# Patient Record
Sex: Male | Born: 1985 | Race: Black or African American | Hispanic: No | Marital: Single | Smoking: Current every day smoker
Health system: Southern US, Community
[De-identification: ages and names within clinical notes are randomized; demographics above are authoritative.]

## PROBLEM LIST (undated history)

## (undated) DIAGNOSIS — Z789 Other specified health status: Secondary | ICD-10-CM

## (undated) DIAGNOSIS — K219 Gastro-esophageal reflux disease without esophagitis: Secondary | ICD-10-CM

## (undated) HISTORY — PX: WRIST SURGERY: SHX841

---

## 2004-12-14 ENCOUNTER — Emergency Department: Payer: Self-pay | Admitting: Emergency Medicine

## 2005-06-13 ENCOUNTER — Emergency Department: Payer: Self-pay | Admitting: Emergency Medicine

## 2006-05-14 ENCOUNTER — Inpatient Hospital Stay: Payer: Self-pay | Admitting: Internal Medicine

## 2009-12-18 ENCOUNTER — Emergency Department: Payer: Self-pay | Admitting: Internal Medicine

## 2014-07-27 ENCOUNTER — Emergency Department: Payer: Self-pay | Admitting: Emergency Medicine

## 2014-07-27 LAB — COMPREHENSIVE METABOLIC PANEL
ALK PHOS: 74 U/L
ALT: 46 U/L
Albumin: 4 g/dL (ref 3.4–5.0)
Anion Gap: 8 (ref 7–16)
BILIRUBIN TOTAL: 0.7 mg/dL (ref 0.2–1.0)
BUN: 14 mg/dL (ref 7–18)
CHLORIDE: 103 mmol/L (ref 98–107)
CO2: 24 mmol/L (ref 21–32)
CREATININE: 1.49 mg/dL — AB (ref 0.60–1.30)
Calcium, Total: 9 mg/dL (ref 8.5–10.1)
EGFR (African American): >60
EGFR (Non-African Amer.): 60 — ABNORMAL LOW
GLUCOSE: 135 mg/dL — AB (ref 65–99)
Osmolality: 273 (ref 275–301)
POTASSIUM: 3.9 mmol/L (ref 3.5–5.1)
SGOT(AST): 36 U/L (ref 15–37)
Sodium: 135 mmol/L — ABNORMAL LOW (ref 136–145)
Total Protein: 8.3 g/dL — ABNORMAL HIGH (ref 6.4–8.2)

## 2014-07-27 LAB — CBC WITH DIFFERENTIAL/PLATELET
Basophil #: 0 10*3/uL (ref 0.0–0.1)
Basophil %: 0.5 %
EOS PCT: 0.1 %
Eosinophil #: 0 10*3/uL (ref 0.0–0.7)
HCT: 41.4 % (ref 40.0–52.0)
HGB: 13 g/dL (ref 13.0–18.0)
Lymphocyte #: 0.5 10*3/uL — ABNORMAL LOW (ref 1.0–3.6)
Lymphocyte %: 5.5 %
MCH: 25.6 pg — ABNORMAL LOW (ref 26.0–34.0)
MCHC: 31.4 g/dL — AB (ref 32.0–36.0)
MCV: 82 fL (ref 80–100)
MONO ABS: 0.9 x10 3/mm (ref 0.2–1.0)
MONOS PCT: 9.1 %
Neutrophil #: 8.4 10*3/uL — ABNORMAL HIGH (ref 1.4–6.5)
Neutrophil %: 84.8 %
Platelet: 148 10*3/uL — ABNORMAL LOW (ref 150–440)
RBC: 5.08 10*6/uL (ref 4.40–5.90)
RDW: 13.1 % (ref 11.5–14.5)
WBC: 9.9 10*3/uL (ref 3.8–10.6)

## 2014-07-27 LAB — LIPASE, BLOOD: Lipase: 122 U/L (ref 73–393)

## 2015-05-23 ENCOUNTER — Observation Stay
Admission: EM | Admit: 2015-05-23 | Discharge: 2015-05-24 | Disposition: A | Discharge status: Home / Self Care | Payer: Self-pay | Attending: Internal Medicine | Admitting: Internal Medicine

## 2015-05-23 ENCOUNTER — Emergency Department: Payer: Self-pay

## 2015-05-23 ENCOUNTER — Encounter: Payer: Self-pay | Admitting: Emergency Medicine

## 2015-05-23 DIAGNOSIS — R109 Unspecified abdominal pain: Secondary | ICD-10-CM

## 2015-05-23 DIAGNOSIS — Z23 Encounter for immunization: Secondary | ICD-10-CM | POA: Insufficient documentation

## 2015-05-23 DIAGNOSIS — D72829 Elevated white blood cell count, unspecified: Secondary | ICD-10-CM | POA: Insufficient documentation

## 2015-05-23 DIAGNOSIS — R110 Nausea: Secondary | ICD-10-CM

## 2015-05-23 DIAGNOSIS — Z79899 Other long term (current) drug therapy: Secondary | ICD-10-CM | POA: Insufficient documentation

## 2015-05-23 DIAGNOSIS — F17290 Nicotine dependence, other tobacco product, uncomplicated: Secondary | ICD-10-CM | POA: Insufficient documentation

## 2015-05-23 DIAGNOSIS — K529 Noninfective gastroenteritis and colitis, unspecified: Secondary | ICD-10-CM | POA: Diagnosis present

## 2015-05-23 DIAGNOSIS — R197 Diarrhea, unspecified: Secondary | ICD-10-CM

## 2015-05-23 DIAGNOSIS — A084 Viral intestinal infection, unspecified: Principal | ICD-10-CM | POA: Insufficient documentation

## 2015-05-23 DIAGNOSIS — E860 Dehydration: Secondary | ICD-10-CM | POA: Insufficient documentation

## 2015-05-23 DIAGNOSIS — R112 Nausea with vomiting, unspecified: Secondary | ICD-10-CM | POA: Diagnosis present

## 2015-05-23 HISTORY — DX: Other specified health status: Z78.9

## 2015-05-23 LAB — COMPREHENSIVE METABOLIC PANEL
ALK PHOS: 60 U/L (ref 38–126)
ALT: 25 U/L (ref 17–63)
ANION GAP: 11 (ref 5–15)
AST: 28 U/L (ref 15–41)
Albumin: 4.8 g/dL (ref 3.5–5.0)
BILIRUBIN TOTAL: 0.9 mg/dL (ref 0.3–1.2)
BUN: 19 mg/dL (ref 6–20)
CALCIUM: 9.7 mg/dL (ref 8.9–10.3)
CO2: 23 mmol/L (ref 22–32)
CREATININE: 1.32 mg/dL — AB (ref 0.61–1.24)
Chloride: 109 mmol/L (ref 101–111)
GFR calc non Af Amer: >60 mL/min (ref >60)
Glucose, Bld: 117 mg/dL — ABNORMAL HIGH (ref 65–99)
Potassium: 3.9 mmol/L (ref 3.5–5.1)
Sodium: 143 mmol/L (ref 135–145)
TOTAL PROTEIN: 8.2 g/dL — AB (ref 6.5–8.1)

## 2015-05-23 LAB — CBC
HCT: 45.8 % (ref 40.0–52.0)
Hemoglobin: 14.4 g/dL (ref 13.0–18.0)
MCH: 25.4 pg — ABNORMAL LOW (ref 26.0–34.0)
MCHC: 31.5 g/dL — ABNORMAL LOW (ref 32.0–36.0)
MCV: 80.8 fL (ref 80.0–100.0)
PLATELETS: 166 10*3/uL (ref 150–440)
RBC: 5.67 MIL/uL (ref 4.40–5.90)
RDW: 13.6 % (ref 11.5–14.5)
WBC: 18.7 10*3/uL — AB (ref 3.8–10.6)

## 2015-05-23 LAB — URINALYSIS COMPLETE WITH MICROSCOPIC (ARMC ONLY)
BILIRUBIN URINE: NEGATIVE
Bacteria, UA: NONE SEEN
GLUCOSE, UA: NEGATIVE mg/dL
HGB URINE DIPSTICK: NEGATIVE
LEUKOCYTES UA: NEGATIVE
NITRITE: NEGATIVE
PH: 9 — AB (ref 5.0–8.0)
Protein, ur: 100 mg/dL — AB
SPECIFIC GRAVITY, URINE: 1.027 (ref 1.005–1.030)

## 2015-05-23 LAB — LIPASE, BLOOD: Lipase: 20 U/L — ABNORMAL LOW (ref 22–51)

## 2015-05-23 MED ORDER — ONDANSETRON HCL 4 MG/2ML IJ SOLN
4.0000 mg | Freq: Once | INTRAMUSCULAR | Status: AC | PRN
  Administered 2015-05-23: 4 mg via INTRAVENOUS
  Filled 2015-05-23: qty 2

## 2015-05-23 MED ORDER — PANTOPRAZOLE SODIUM 40 MG IV SOLR
40.0000 mg | INTRAVENOUS | Status: DC
  Administered 2015-05-24: 40 mg via INTRAVENOUS
  Filled 2015-05-23: qty 40

## 2015-05-23 MED ORDER — SODIUM CHLORIDE 0.9 % IV BOLUS (SEPSIS)
1000.0000 mL | Freq: Once | INTRAVENOUS | Status: AC
  Administered 2015-05-23: 1000 mL via INTRAVENOUS

## 2015-05-23 MED ORDER — PROMETHAZINE HCL 25 MG/ML IJ SOLN
25.0000 mg | Freq: Once | INTRAMUSCULAR | Status: AC
  Administered 2015-05-23: 25 mg via INTRAVENOUS
  Filled 2015-05-23: qty 1

## 2015-05-23 MED ORDER — ONDANSETRON HCL 4 MG/2ML IJ SOLN
4.0000 mg | Freq: Once | INTRAMUSCULAR | Status: AC
  Administered 2015-05-23: 4 mg via INTRAVENOUS
  Filled 2015-05-23: qty 2

## 2015-05-23 MED ORDER — ONDANSETRON HCL 4 MG PO TABS: 4.0000 mg | ORAL_TABLET | Freq: Four times a day (QID) | ORAL | Status: DC | PRN

## 2015-05-23 MED ORDER — ENOXAPARIN SODIUM 40 MG/0.4ML ~~LOC~~ SOLN
40.0000 mg | SUBCUTANEOUS | Status: DC
  Administered 2015-05-24: 40 mg via SUBCUTANEOUS
  Filled 2015-05-23: qty 0.4

## 2015-05-23 MED ORDER — ONDANSETRON HCL 4 MG/2ML IJ SOLN: 4.0000 mg | Freq: Four times a day (QID) | INTRAMUSCULAR | Status: DC | PRN

## 2015-05-23 MED ORDER — PROMETHAZINE HCL 25 MG/ML IJ SOLN: 12.5000 mg | Freq: Four times a day (QID) | INTRAMUSCULAR | Status: DC | PRN

## 2015-05-23 MED ORDER — ONDANSETRON HCL 4 MG PO TABS: 4.0000 mg | ORAL_TABLET | Freq: Three times a day (TID) | ORAL | Status: DC | PRN

## 2015-05-23 MED ORDER — SODIUM CHLORIDE 0.9 % IV SOLN
INTRAVENOUS | Status: AC
  Administered 2015-05-24: via INTRAVENOUS

## 2015-05-23 MED ORDER — ACETAMINOPHEN 650 MG RE SUPP
650.0000 mg | Freq: Four times a day (QID) | RECTAL | Status: DC | PRN
Start: 2015-05-23 — End: 2015-05-24

## 2015-05-23 MED ORDER — INFLUENZA VAC SPLIT QUAD 0.5 ML IM SUSY
0.5000 mL | PREFILLED_SYRINGE | INTRAMUSCULAR | Status: AC
  Administered 2015-05-24: 0.5 mL via INTRAMUSCULAR
  Filled 2015-05-23: qty 0.5

## 2015-05-23 MED ORDER — ACETAMINOPHEN 325 MG PO TABS: 650.0000 mg | ORAL_TABLET | Freq: Four times a day (QID) | ORAL | Status: DC | PRN

## 2015-05-23 NOTE — Plan of Care (Signed)
Problem: Discharge Progression Outcomes Goal: Discharge plan in place and appropriate Individualization Admitted with N/V x 1 day after eating at [employer] No Medical history

## 2015-05-23 NOTE — H&P (Addendum)
[facility] [place] [facility]   PATIENT NAME: Gilbert Reed    MR#:  [id number]  DATE OF BIRTH:  1986/03/13  DATE OF ADMISSION:  05/23/2015  PRIMARY CARE PHYSICIAN: No primary care provider on file.   REQUESTING/REFERRING PHYSICIAN: Shaune PollackLord, MD  CHIEF COMPLAINT:   Chief Complaint  Patient presents with  . Emesis  . Diarrhea  . Abdominal Pain    HISTORY OF PRESENT ILLNESS:  Gilbert Reed  is a 29 y.o. male who presents with 1 day of nausea and vomiting and diarrhea. Patient states that he has not had any recent sick contacts. He states that his sister had to go to the hospital 24 hours prior to his admission for GI upset, but he has not had any recent contact with her. The evening prior to onset of his symptoms he ate a meal at [employer]. He does not have significant overt abdominal pain, denies any blood in his stool or emesis. He vomited something on the order of 12-15 times at home prior to coming to the ED. In the ED he was given multiple doses of antiemetics, and still persisted with nausea and vomiting. Lab workup largely benign except for an elevated white count at 18. Patient does state that he feels like he may have had some intermittent chills over the same time course. Hospitalists were called for admission for intractable nausea and vomiting with dehydration.  PAST MEDICAL HISTORY:   Past Medical History  Diagnosis Date  . Patient denies medical problems     PAST SURGICAL HISTORY:   Past Surgical History  Procedure Laterality Date  . Wrist surgery      SOCIAL HISTORY:   Social History  Substance Use Topics  . Smoking status: Current Every Day Smoker    Types: Cigars  . Smokeless tobacco: Never Used  . Alcohol Use: Yes     Comment: Weekend drinker- 3 beers    FAMILY HISTORY:  History reviewed. No pertinent family history.  DRUG ALLERGIES:  No Known Allergies  MEDICATIONS AT HOME:   Prior to Admission medications    Medication Sig Start Date End Date Taking? Authorizing Provider  ondansetron (ZOFRAN) 4 MG tablet Take 1 tablet (4 mg total) by mouth every 8 (eight) hours as needed for nausea or vomiting. 05/23/15   Governor Rooksebecca Lord, MD    REVIEW OF SYSTEMS:  Review of Systems  Constitutional: Positive for chills. Negative for fever, weight loss and malaise/fatigue.  HENT: Negative for ear pain, hearing loss and tinnitus.   Eyes: Negative for blurred vision, double vision, pain and redness.  Respiratory: Negative for cough, hemoptysis and shortness of breath.   Cardiovascular: Negative for chest pain, palpitations, orthopnea and leg swelling.  Gastrointestinal: Positive for nausea, vomiting and diarrhea. Negative for abdominal pain and constipation.  Genitourinary: Negative for dysuria, frequency and hematuria.  Musculoskeletal: Negative for back pain, joint pain and neck pain.  Skin:       No acne, rash, or lesions  Neurological: Negative for dizziness, tremors, focal weakness and weakness.  Endo/Heme/Allergies: Negative for polydipsia. Does not bruise/bleed easily.  Psychiatric/Behavioral: Negative for depression. The patient is not nervous/anxious and does not have insomnia.      VITAL SIGNS:   Filed Vitals:   05/23/15 1638 05/23/15 1913  BP: 128/82 139/63  Pulse: 77 73  Temp: 97.8 F (36.6 C)   TempSrc: Oral   Resp: 18 22  Height: 5\' 9"  (1.753 m)   Weight: 73.483 kg (  162 lb)   SpO2: 100% 99%   Wt Readings from Last 3 Encounters:  05/23/15 73.483 kg (162 lb)    PHYSICAL EXAMINATION:  Physical Exam  Vitals reviewed. Constitutional: He is oriented to person, place, and time. He appears well-developed and well-nourished. No distress.  HENT:  Head: Normocephalic and atraumatic.  Dry mucous membranes  Eyes: Conjunctivae and EOM are normal. Pupils are equal, round, and reactive to light. No scleral icterus.  Neck: Normal range of motion. Neck supple. No JVD present. No thyromegaly present.   Cardiovascular: Normal rate, regular rhythm and intact distal pulses.  Exam reveals no gallop and no friction rub.   No murmur heard. Respiratory: Effort normal and breath sounds normal. No respiratory distress. He has no wheezes. He has no rales.  GI: Soft. Bowel sounds are normal. He exhibits no distension. There is no tenderness.  Musculoskeletal: Normal range of motion. He exhibits no edema.  No arthritis, no gout  Lymphadenopathy:    He has no cervical adenopathy.  Neurological: He is alert and oriented to person, place, and time. No cranial nerve deficit.  No dysarthria, no aphasia  Skin: Skin is warm and dry. No rash noted. No erythema.  Psychiatric: He has a normal mood and affect. His behavior is normal. Judgment and thought content normal.    LABORATORY PANEL:   CBC  Recent Labs Lab 05/23/15 1628  WBC 18.7*  HGB 14.4  HCT 45.8  PLT 166   ------------------------------------------------------------------------------------------------------------------  Chemistries   Recent Labs Lab 05/23/15 1801  NA 143  K 3.9  CL 109  CO2 23  GLUCOSE 117*  BUN 19  CREATININE 1.32*  CALCIUM 9.7  AST 28  ALT 25  ALKPHOS 60  BILITOT 0.9   ------------------------------------------------------------------------------------------------------------------  Cardiac Enzymes No results for input(s): TROPONINI in the last 168 hours. ------------------------------------------------------------------------------------------------------------------  RADIOLOGY:  Dg Abd 2 Views  05/23/2015  CLINICAL DATA:  Abdominal pain. EXAM: ABDOMEN - 2 VIEW COMPARISON:  None. FINDINGS: The bowel gas pattern is normal. There is no evidence of free air. No concerning intra-abdominal mass effect or calcification. Lung bases are clear. IMPRESSION: Negative. Electronically Signed   By: Marnee Spring M.D.   On: 05/23/2015 17:55    EKG:  No orders found for this or any previous  visit.  IMPRESSION AND PLAN:  Principal Problem:   Intractable nausea and vomiting - IV antiemetics and IV fluids for hydration. Monitor his white count as below. Clear liquid diet as tolerated, the patient states he may just take with ice chips for now. Suspect viral gastroenteritis versus potential food poisoning. Active Problems:   Gastroenteritis - suspicion for etiology as above. We'll also use PPI while here for additional antireflux effect.   Dehydration - IV fluids   Leukocytosis - possibly due to dehydration. However, we'll monitor closely and if not falling back to normal level by morning, or if develops other infectious symptoms such as fever tonight, we'll consider cultures and antibiotics.  All the records are reviewed and case discussed with ED provider. Management plans discussed with the patient and/or family.  DVT PROPHYLAXIS: SubQ lovenox  ADMISSION STATUS: Observation  CODE STATUS: Full  TOTAL TIME TAKING CARE OF THIS PATIENT: 40 minutes.    Dashiell Franchino FIELDING 05/23/2015, 9:17 PM  [facility]  Office  [phone]  CC: Primary care physician; No primary care provider on file.

## 2015-05-23 NOTE — ED Notes (Signed)
Patient transported to X-ray 

## 2015-05-23 NOTE — ED Provider Notes (Addendum)
[facility] Emergency Department Provider Note   ____________________________________________  Time seen: On arrival to ED room I have reviewed the triage vital signs and the triage nursing note.  HISTORY  Chief Complaint Emesis; Diarrhea; and Abdominal Pain   Historian Patient  HPI Gilbert Reed is a 29 y.o. male is here for evaluation of vomiting diarrhea. This started this morning. It's been numerous episodes of vomiting and diarrhea. No known bad food. Last meal was [employer]. No sick contacts. No fever. Patient is feeling dehydrated. Mild abdominal cramping.    History reviewed. No pertinent past medical history.  There are no active problems to display for this patient.   Past Surgical History  Procedure Laterality Date  . Wrist surgery      Current Outpatient Rx  Name  Route  Sig  Dispense  Refill  . ondansetron (ZOFRAN) 4 MG tablet   Oral   Take 1 tablet (4 mg total) by mouth every 8 (eight) hours as needed for nausea or vomiting.   10 tablet   0     Allergies Review of patient's allergies indicates no known allergies.  History reviewed. No pertinent family history.  Social History Social History  Substance Use Topics  . Smoking status: Current Every Day Smoker    Types: Cigars  . Smokeless tobacco: Never Used  . Alcohol Use: Yes     Comment: Weekend drinker- 3 beers    Review of Systems  Constitutional: Negative for fever. Eyes: Negative for visual changes. ENT: Negative for sore throat. Cardiovascular: Negative for chest pain. Respiratory: Negative for shortness of breath. Gastrointestinal: no bloody stool. Genitourinary: Negative for dysuria. Musculoskeletal: Negative for back pain. Skin: Negative for rash. Neurological: Negative for headache. 10 point Review of Systems otherwise negative ____________________________________________   PHYSICAL EXAM:  VITAL SIGNS: ED Triage Vitals  Enc Vitals Group     BP  05/23/15 1638 128/82 mmHg     Pulse Rate 05/23/15 1638 77     Resp 05/23/15 1638 18     Temp 05/23/15 1638 97.8 F (36.6 C)     Temp Source 05/23/15 1638 Oral     SpO2 05/23/15 1638 100 %     Weight 05/23/15 1638 162 lb (73.483 kg)     Height 05/23/15 1638  (1.753 m)     Head Cir --      Peak Flow --      Pain Score 05/23/15 1638 7     Pain Loc --      Pain Edu? --      Excl. in GC? --      Constitutional: Alert and oriented. Well appearing and in no distress. Eyes: Conjunctivae are normal. PERRL. Normal extraocular movements. ENT   Head: Normocephalic and atraumatic.   Nose: No congestion/rhinnorhea.   Mouth/Throat: Mucous membranes are moist.   Neck: No stridor. Cardiovascular/Chest: Normal rate, regular rhythm.  No murmurs, rubs, or gallops. Respiratory: Normal respiratory effort without tachypnea nor retractions. Breath sounds are clear and equal bilaterally. No wheezes/rales/rhonchi. Gastrointestinal: Soft. No distention, no guarding, no rebound. Mild discomfort diffusely.  Genitourinary/rectal:Deferred Musculoskeletal: Nontender with normal range of motion in all extremities. No joint effusions.  No lower extremity tenderness.  No edema. Neurologic:  Normal speech and language. No gross or focal neurologic deficits are appreciated. Skin:  Skin is warm, dry and intact. No rash noted. Psychiatric: Mood and affect are normal. Speech and behavior are normal. Patient exhibits appropriate insight and judgment.  ____________________________________________   EKG I, Governor Rooksebecca Areonna Bran, MD, the attending physician have personally viewed and interpreted all ECGs.  No EKG performed ____________________________________________  LABS (pertinent positives/negatives)  Urinalysis 2+ ketones, negative for nitrites, leukocytes, red blood cells, white blood cells, and bacteria Lipase 20 Come for hence the metabolic panel significant for cranial 1.32 and otherwise no  significant abnormality White blood cell count 18.7, hemoglobin 14.4 and platelet count 166  ____________________________________________  RADIOLOGY All Xrays were viewed by me. Imaging interpreted by Radiologist.  Abdomen 2 view: Negative __________________________________________  PROCEDURES  Procedure(s) performed: None  Critical Care performed: None  ____________________________________________   ED COURSE / ASSESSMENT AND PLAN  CONSULTATIONS:hospitalist for admission.  Pertinent labs & imaging results that were available during my care of the patient were reviewed by me and considered in my medical decision making (see chart for details).   Afebrile here in stable vital signs. Patient does look dehydrated, and was given symptomatic medications here in the emergency department including normal saline, Zofran and then a dose of Phenergan. He will also be given a dose of loperamide.  Laboratory evaluation is reassuring with respect to kidney function and electrolytes. He does have an elevated white blood cell count.  His abdomen exam is actually nonfocal and minimally tender, and I do not suspect an acute surgical/medical emergency such as appendicitis, diverticulosis, perforation, obstruction, and I do not feel a CT abdomen is warranted based on his clinical exam. I do want him to have close follow-up, however asked him to follow-up in 2 days at the [facility] or his primary care physician.  ----------------------------------------- 9:07 PM on 05/23/2015 -----------------------------------------  After IV fluids and multiple doses of IV anti-nausea medication, patient threw up again. He is continued overnight observation and IV fluids/nausea medication.  Patient / Family / Caregiver informed of clinical course, medical decision-making process, and agree with plan.   I discussed return precautions, follow-up instructions, and discharged instructions with patient  and/or family.  ___________________________________________   FINAL CLINICAL IMPRESSION(S) / ED DIAGNOSES   Final diagnoses:  Abdominal pain  Diarrhea, unspecified type  Nausea  Intractable vomiting with nausea, unspecified vomiting type       Governor Rooksebecca Emrey Thornley, MD 05/23/15 2007  Governor Rooksebecca Treasure Ingrum, MD 05/23/15 2108

## 2015-05-23 NOTE — ED Notes (Signed)
Pt states at approx this morning he began having vomiting and diarrhea. Pt states he was diagnosed in the past with acid reflux. Pt is actively vomiting in triage, vomit noted to be yellow in color, pt denies any brown in the previous vomiting or diarrhea.

## 2015-05-23 NOTE — Discharge Instructions (Signed)
You were evaluated for vomiting and diarrhea, which I suspect is due to a viral illness. Your exam and evaluation are reassuring here in the emergency department you're given symptomatic medications. You may take over-the-counter loperamide as directed on labeling for diarrhea. You're being prescribed Zofran as needed for nausea.  Return to the emergency department for any worsening condition including fever, lack or bloody stools or vomiting, concern for dehydration such as dry mouth, or making no urine.    Nausea and Vomiting Nausea is a sick feeling that often comes before throwing up (vomiting). Vomiting is a reflex where stomach contents come out of your mouth. Vomiting can cause severe loss of body fluids (dehydration). Children and elderly adults can become dehydrated quickly, especially if they also have diarrhea. Nausea and vomiting are symptoms of a condition or disease. It is important to find the cause of your symptoms. CAUSES   Direct irritation of the stomach lining. This irritation can result from increased acid production (gastroesophageal reflux disease), infection, food poisoning, taking certain medicines (such as nonsteroidal anti-inflammatory drugs), alcohol use, or tobacco use.  Signals from the brain.These signals could be caused by a headache, heat exposure, an inner ear disturbance, increased pressure in the brain from injury, infection, a tumor, or a concussion, pain, emotional stimulus, or metabolic problems.  An obstruction in the gastrointestinal tract (bowel obstruction).  Illnesses such as diabetes, hepatitis, gallbladder problems, appendicitis, kidney problems, cancer, sepsis, atypical symptoms of a heart attack, or eating disorders.  Medical treatments such as chemotherapy and radiation.  Receiving medicine that makes you sleep (general anesthetic) during surgery. DIAGNOSIS Your caregiver may ask for tests to be done if the problems do not improve after a few  days. Tests may also be done if symptoms are severe or if the reason for the nausea and vomiting is not clear. Tests may include:  Urine tests.  Blood tests.  Stool tests.  Cultures (to look for evidence of infection).  X-rays or other imaging studies. Test results can help your caregiver make decisions about treatment or the need for additional tests. TREATMENT You need to stay well hydrated. Drink frequently but in small amounts.You may wish to drink water, sports drinks, clear broth, or eat frozen ice pops or gelatin dessert to help stay hydrated.When you eat, eating slowly may help prevent nausea.There are also some antinausea medicines that may help prevent nausea. HOME CARE INSTRUCTIONS   Take all medicine as directed by your caregiver.  If you do not have an appetite, do not force yourself to eat. However, you must continue to drink fluids.  If you have an appetite, eat a normal diet unless your caregiver tells you differently.  Eat a variety of complex carbohydrates (rice, wheat, potatoes, bread), lean meats, yogurt, fruits, and vegetables.  Avoid high-fat foods because they are more difficult to digest.  Drink enough water and fluids to keep your urine clear or pale yellow.  If you are dehydrated, ask your caregiver for specific rehydration instructions. Signs of dehydration may include:  Severe thirst.  Dry lips and mouth.  Dizziness.  Dark urine.  Decreasing urine frequency and amount.  Confusion.  Rapid breathing or pulse. SEEK IMMEDIATE MEDICAL CARE IF:   You have blood or brown flecks (like coffee grounds) in your vomit.  You have black or bloody stools.  You have a severe headache or stiff neck.  You are confused.  You have severe abdominal pain.  You have chest pain or trouble breathing.  You do not urinate at least once every 8 hours.  You develop cold or clammy skin.  You continue to vomit for longer than 24 to 48 hours.  You have a  fever. MAKE SURE YOU:   Understand these instructions.  Will watch your condition.  Will get help right away if you are not doing well or get worse.   This information is not intended to replace advice given to you by your health care provider. Make sure you discuss any questions you have with your health care provider.   Document Released: 07/24/2005 Document Revised: 10/16/2011 Document Reviewed: 12/21/2010 Elsevier Interactive Patient Education 2016 Elsevier Inc.  Diarrhea Diarrhea is watery poop (stool). It can make you feel weak, tired, thirsty, or give you a dry mouth (signs of dehydration). Watery poop is a sign of another problem, most often an infection. It often lasts 2-3 days. It can last longer if it is a sign of something serious. Take care of yourself as told by your doctor. HOME CARE   Drink 1 cup (8 ounces) of fluid each time you have watery poop.  Do not drink the following fluids:  Those that contain simple sugars (fructose, glucose, galactose, lactose, sucrose, maltose).  Sports drinks.  Fruit juices.  Whole milk products.  Sodas.  Drinks with caffeine (coffee, tea, soda) or alcohol.  Oral rehydration solution may be used if the doctor says it is okay. You may make your own solution. Follow this recipe:   - teaspoon table salt.   teaspoon baking soda.   teaspoon salt substitute containing potassium chloride.  1 tablespoons sugar.  1 liter (34 ounces) of water.  Avoid the following foods:  High fiber foods, such as raw fruits and vegetables.  Nuts, seeds, and whole grain breads and cereals.   Those that are sweetened with sugar alcohols (xylitol, sorbitol, mannitol).  Try eating the following foods:  Starchy foods, such as rice, toast, pasta, low-sugar cereal, oatmeal, baked potatoes, crackers, and bagels.  Bananas.  Applesauce.  Eat probiotic-rich foods, such as yogurt and milk products that are fermented.  Wash your hands well  after each time you have watery poop.  Only take medicine as told by your doctor.  Take a warm bath to help lessen burning or pain from having watery poop. GET HELP RIGHT AWAY IF:   You cannot drink fluids without throwing up (vomiting).  You keep throwing up.  You have blood in your poop, or your poop looks black and tarry.  You do not pee (urinate) in 6-8 hours, or there is only a small amount of very dark pee.  You have belly (abdominal) pain that gets worse or stays in the same spot (localizes).  You are weak, dizzy, confused, or light-headed.  You have a very bad headache.  Your watery poop gets worse or does not get better.  You have a fever or lasting symptoms for more than 2-3 days.  You have a fever and your symptoms suddenly get worse. MAKE SURE YOU:   Understand these instructions.  Will watch your condition.  Will get help right away if you are not doing well or get worse.   This information is not intended to replace advice given to you by your health care provider. Make sure you discuss any questions you have with your health care provider.   Document Released: 01/10/2008 Document Revised: 08/14/2014 Document Reviewed: 03/31/2012 Elsevier Interactive Patient Education [employer]2016 Elsevier Inc.

## 2015-05-24 LAB — BASIC METABOLIC PANEL
Anion gap: 6 (ref 5–15)
BUN: 16 mg/dL (ref 6–20)
CALCIUM: 8.8 mg/dL — AB (ref 8.9–10.3)
CO2: 26 mmol/L (ref 22–32)
CREATININE: 1.11 mg/dL (ref 0.61–1.24)
Chloride: 111 mmol/L (ref 101–111)
Glucose, Bld: 99 mg/dL (ref 65–99)
Potassium: 3.8 mmol/L (ref 3.5–5.1)
SODIUM: 143 mmol/L (ref 135–145)

## 2015-05-24 LAB — CBC
HCT: 38.2 % — ABNORMAL LOW (ref 40.0–52.0)
Hemoglobin: 12 g/dL — ABNORMAL LOW (ref 13.0–18.0)
MCH: 25.2 pg — ABNORMAL LOW (ref 26.0–34.0)
MCHC: 31.3 g/dL — ABNORMAL LOW (ref 32.0–36.0)
MCV: 80.3 fL (ref 80.0–100.0)
Platelets: 134 10*3/uL — ABNORMAL LOW (ref 150–440)
RBC: 4.76 MIL/uL (ref 4.40–5.90)
RDW: 13.2 % (ref 11.5–14.5)
WBC: 13.8 10*3/uL — AB (ref 3.8–10.6)

## 2015-05-24 MED ORDER — ONDANSETRON 4 MG PO TBDP: 4.0000 mg | ORAL_TABLET | Freq: Three times a day (TID) | ORAL | Status: DC | PRN

## 2015-05-24 NOTE — Discharge Summary (Signed)
[facility] - [place] at [facility]   PATIENT NAME: Gilbert Reed    MR#:  [id number]  DATE OF BIRTH:  07-14-1986  DATE OF ADMISSION:  05/23/2015 ADMITTING PHYSICIAN: Oralia Manis, MD  DATE OF DISCHARGE: 05/24/2015  PRIMARY CARE PHYSICIAN: No primary care provider on file.    ADMISSION DIAGNOSIS:  Nausea [R11.0] Abdominal pain [R10.9] Diarrhea, unspecified type [R19.7] Intractable vomiting with nausea, unspecified vomiting type [R11.2]  DISCHARGE DIAGNOSIS:  Principal Problem:   Intractable nausea and vomiting Active Problems:   Gastroenteritis   Dehydration   Leukocytosis   SECONDARY DIAGNOSIS:   Past Medical History  Diagnosis Date  . Patient denies medical problems     HOSPITAL COURSE:  29 year old male with no past medical history who presented with viral gastroenteritis. For further details please refer to H&P.  1. Viral gastroenteritis with intractable nausea and vomiting: Patient symptoms have much improved with IV fluids and short hospitalization. Patient is tolerating his diet.  2. Leukocytosis: This is secondary to dehydration and what but still has improved. There is no bacterial source of infection.  3. Dehydration: Patient was hydrated with IV fluids.  4. Tobacco dependence: Patient is encouraged to smoking. Patient was counseled for 3 minutes regarding this.   DISCHARGE CONDITIONS AND DIET:  Home in stable condition on a regular diet  CONSULTS OBTAINED:     DRUG ALLERGIES:  No Known Allergies  DISCHARGE MEDICATIONS:   Current Discharge Medication List    START taking these medications   Details  ondansetron (ZOFRAN) 4 MG tablet Take 1 tablet (4 mg total) by mouth every 8 (eight) hours as needed for nausea or vomiting. Qty: 10 tablet, Refills: 0              Today   CHIEF COMPLAINT:  Patient is doing well this morning. Patient is ready to be discharged home. Patient has no abdominal pain, nausea or  vomiting.   VITAL SIGNS:  Blood pressure 116/54, pulse 86, temperature 99.4 F (37.4 C), temperature source Oral, resp. rate 18, height  (1.753 m), weight 70.035 kg (154 lb 6.4 oz), SpO2 100 %.   REVIEW OF SYSTEMS:  Review of Systems  Constitutional: Negative for fever, chills and malaise/fatigue.  HENT: Negative for sore throat.   Eyes: Negative for blurred vision.  Respiratory: Negative for cough, hemoptysis, shortness of breath and wheezing.   Cardiovascular: Negative for chest pain, palpitations and leg swelling.  Gastrointestinal: Negative for nausea, vomiting, abdominal pain, diarrhea and blood in stool.  Genitourinary: Negative for dysuria.  Musculoskeletal: Negative for back pain.  Neurological: Negative for dizziness, tremors and headaches.  Endo/Heme/Allergies: Does not bruise/bleed easily.     PHYSICAL EXAMINATION:  GENERAL:  29 y.o.-year-old patient lying in the bed with no acute distress.  NECK:  Supple, no jugular venous distention. No thyroid enlargement, no tenderness.  LUNGS: Normal breath sounds bilaterally, no wheezing, rales,rhonchi  No use of accessory muscles of respiration.  CARDIOVASCULAR: S1, S2 normal. No murmurs, rubs, or gallops.  ABDOMEN: Soft, non-tender, non-distended. Bowel sounds present. No organomegaly or mass.  EXTREMITIES: No pedal edema, cyanosis, or clubbing.  PSYCHIATRIC: The patient is alert and oriented x 3.  SKIN: No obvious rash, lesion, or ulcer.   DATA REVIEW:   CBC  Recent Labs Lab 05/24/15 0445  WBC 13.8*  HGB 12.0*  HCT 38.2*  PLT 134*    Chemistries   Recent Labs Lab 05/23/15 1801 05/24/15 0445  NA 143 143  K  3.9 3.8  CL 109 111  CO2 23 26  GLUCOSE 117* 99  BUN 19 16  CREATININE 1.32* 1.11  CALCIUM 9.7 8.8*  AST 28  --   ALT 25  --   ALKPHOS 60  --   BILITOT 0.9  --     Cardiac Enzymes No results for input(s): TROPONINI in the last 168 hours.  Microbiology Results  @MICRORSLT48 @  RADIOLOGY:   Dg Abd 2 Views  05/23/2015  CLINICAL DATA:  Abdominal pain. EXAM: ABDOMEN - 2 VIEW COMPARISON:  None. FINDINGS: The bowel gas pattern is normal. There is no evidence of free air. No concerning intra-abdominal mass effect or calcification. Lung bases are clear. IMPRESSION: Negative. Electronically Signed   By: Marnee SpringJonathon  Watts M.D.   On: 05/23/2015 17:55      Management plans discussed with the patient and he is in agreement. Stable for discharge home  Patient should follow up with a primary care physician in one week  CODE STATUS:     Code Status Orders        Start     Ordered   05/23/15 2256  Full code   Continuous     05/23/15 2255      TOTAL TIME TAKING CARE OF THIS PATIENT: 35 minutes.    Note: This dictation was prepared with Dragon dictation along with smaller phrase technology. Any transcriptional errors that result from this process are unintentional.  Domique Reardon M.D on 05/24/2015 at 10:51 AM  Between 7am to 6pm - Pager - [phone] After 6pm go to [email or web address] - password [email or web address] [facility]  [facility]  Office  [phone]  CC: Primary care physician; No primary care provider on file.

## 2015-05-24 NOTE — Progress Notes (Signed)
[facility]         [place], [place]   05/24/2015  Patient: Gilbert Reed   Date of Birth:  [birth year]  Date of admission:  05/23/2015  Date of Discharge  05/24/2015    To Whom it May Concern:   Gilbert Reed  may return to work on 05/25/15.  PHYSICAL ACTIVITY:  Full  If you have any questions or concerns, please don't hesitate to call.  Sincerely,   Altamese DillingVACHHANI, Otis Portal M.D Pager [phone]- [phone] Office : [phone]   .

## 2015-05-24 NOTE — Progress Notes (Addendum)
Pt discharged home per MD order. Prescription given, activity, diet, discharge instructions and follow up care reviewed with the patient. IV removed. Work note given to patient. All questions answered, patient verbalized understanding. Pt left via wheelchair with nursing staff and family.

## 2015-05-24 NOTE — Plan of Care (Signed)
Problem: Discharge Progression Outcomes Goal: Other Discharge Outcomes/Goals Outcome: Progressing Pt alert and oriented. No complaints of pain. Upon arrival to floor pt c/o nausea. PRN medication given in ED prior to coming to floor. Nausea has subsided. Pt able to tolerate ginger-ale this shift. No N/V/D since admission. VSS. Ambulates independently.

## 2016-12-21 ENCOUNTER — Encounter: Payer: Self-pay | Admitting: Emergency Medicine

## 2016-12-21 ENCOUNTER — Emergency Department
Admission: EM | Admit: 2016-12-21 | Discharge: 2016-12-21 | Disposition: A | Discharge status: Home / Self Care | Payer: No Typology Code available for payment source | Attending: Emergency Medicine | Admitting: Emergency Medicine

## 2016-12-21 DIAGNOSIS — Y9389 Activity, other specified: Secondary | ICD-10-CM | POA: Insufficient documentation

## 2016-12-21 DIAGNOSIS — Y999 Unspecified external cause status: Secondary | ICD-10-CM | POA: Insufficient documentation

## 2016-12-21 DIAGNOSIS — S3992XA Unspecified injury of lower back, initial encounter: Secondary | ICD-10-CM | POA: Diagnosis present

## 2016-12-21 DIAGNOSIS — F17290 Nicotine dependence, other tobacco product, uncomplicated: Secondary | ICD-10-CM | POA: Diagnosis not present

## 2016-12-21 DIAGNOSIS — S39012A Strain of muscle, fascia and tendon of lower back, initial encounter: Secondary | ICD-10-CM | POA: Insufficient documentation

## 2016-12-21 DIAGNOSIS — Y92410 Unspecified street and highway as the place of occurrence of the external cause: Secondary | ICD-10-CM | POA: Insufficient documentation

## 2016-12-21 MED ORDER — CYCLOBENZAPRINE HCL 5 MG PO TABS: 5.0000 mg | ORAL_TABLET | Freq: Three times a day (TID) | ORAL | 0 refills | Status: DC | PRN
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
## 2017-06-20 ENCOUNTER — Emergency Department
Admission: EM | Admit: 2017-06-20 | Discharge: 2017-06-20 | Disposition: A | Discharge status: Home / Self Care | Payer: Self-pay | Attending: Emergency Medicine | Admitting: Emergency Medicine

## 2017-06-20 ENCOUNTER — Encounter: Payer: Self-pay | Admitting: Intensive Care

## 2017-06-20 DIAGNOSIS — K219 Gastro-esophageal reflux disease without esophagitis: Secondary | ICD-10-CM | POA: Insufficient documentation

## 2017-06-20 DIAGNOSIS — F17290 Nicotine dependence, other tobacco product, uncomplicated: Secondary | ICD-10-CM | POA: Insufficient documentation

## 2017-06-20 HISTORY — DX: Gastro-esophageal reflux disease without esophagitis: K21.9

## 2017-06-20 MED ORDER — OMEPRAZOLE 10 MG PO CPDR: 10.0000 mg | DELAYED_RELEASE_CAPSULE | Freq: Every day | ORAL | 1 refills | Status: DC

## 2017-06-20 MED ORDER — RANITIDINE HCL 300 MG PO TABS: 300.0000 mg | ORAL_TABLET | Freq: Every day | ORAL | 1 refills | Status: DC

## 2017-06-20 NOTE — ED Triage Notes (Signed)
Patient c/o acid reflux this AM. He states he is here because nothing OTC works and he wants to get some medicine for his acid reflux that is bad in the mornings. No distress noted in triage

## 2017-06-20 NOTE — ED Provider Notes (Signed)
[facility] Emergency Department Provider Note  ____________________________________________  Time seen: Approximately 5:49 PM  I have reviewed the triage vital signs and the nursing notes.   HISTORY  Chief Complaint Gastroesophageal Reflux    HPI Gilbert Reed is a 31 y.o. male who presents emergency department complaining of acid reflux symptoms.  Patient reports that he has intermittent history of acid reflux symptoms.  He does not take any medications on a regular basis for acid reflux.  Patient reports that as a teenager he was on Nexium which worked well but he does not like taking daily medications and typically does not have a problem with his reflux.  Patient reports that he believes that this is mostly diet related as he is eating more spicy and greasy foods recently.  Patient denies any abdominal pain, diarrhea or constipation.  No emesis.  Patient reports that symptoms are worse when laying.  No other complaints at this time.  Past Medical History:  Diagnosis Date  . Acid reflux   . Patient denies medical problems     Patient Active Problem List   Diagnosis Date Noted  . Gastroenteritis 05/23/2015  . Intractable nausea and vomiting 05/23/2015  . Dehydration 05/23/2015  . Leukocytosis 05/23/2015    Past Surgical History:  Procedure Laterality Date  . WRIST SURGERY      Prior to Admission medications   Medication Sig Start Date End Date Taking? Authorizing Provider  cyclobenzaprine (FLEXERIL) 5 MG tablet Take 1 tablet (5 mg total) by mouth 3 (three) times daily as needed for muscle spasms. 12/21/16   Menshew, Charlesetta IvoryJenise V Bacon, PA-C  omeprazole (PRILOSEC) 10 MG capsule Take 1 capsule (10 mg total) daily by mouth. 06/20/17   Cloee Dunwoody, Delorise RoyalsJonathan D, PA-C  ondansetron (ZOFRAN ODT) 4 MG disintegrating tablet Take 1 tablet (4 mg total) by mouth every 8 (eight) hours as needed for nausea or vomiting. 05/24/15   Adrian SaranMody, Sital, MD  ranitidine (ZANTAC) 300  MG tablet Take 1 tablet (300 mg total) at bedtime by mouth. 06/20/17 06/20/18  Chantae Soo, Delorise RoyalsJonathan D, PA-C    Allergies Patient has no known allergies.  History reviewed. No pertinent family history.  Social History Social History   Tobacco Use  . Smoking status: Current Every Day Smoker    Types: Cigars  . Smokeless tobacco: Never Used  Substance Use Topics  . Alcohol use: Yes    Comment: Weekend drinker- 3 beers  . Drug use: Yes    Types: Marijuana     Review of Systems  Constitutional: No fever/chills Eyes: No visual changes.  Cardiovascular: no chest pain. Respiratory: no cough. No SOB. Gastrointestinal: Positive for reflux symptoms.  No abdominal pain.  No nausea, no vomiting.  No diarrhea.  No constipation. Musculoskeletal: Negative for musculoskeletal pain. Skin: Negative for rash, abrasions, lacerations, ecchymosis. Neurological: Negative for headaches, focal weakness or numbness. 10-point ROS otherwise negative.  ____________________________________________   PHYSICAL EXAM:  VITAL SIGNS: ED Triage Vitals  Enc Vitals Group     BP 06/20/17 1735 133/76     Pulse Rate 06/20/17 1735 71     Resp 06/20/17 1735 14     Temp 06/20/17 1735 98.9 F (37.2 C)     Temp Source 06/20/17 1735 Oral     SpO2 06/20/17 1735 99 %     Weight 06/20/17 1734 165 lb (74.8 kg)     Height 06/20/17 1734 5\' 9"  (1.753 m)     Head Circumference --  Peak Flow --      Pain Score --      Pain Loc --      Pain Edu? --      Excl. in GC? --      Constitutional: Alert and oriented. Well appearing and in no acute distress. Eyes: Conjunctivae are normal. PERRL. EOMI. Head: Atraumatic. ENT:      Ears:       Nose: No congestion/rhinnorhea.      Mouth/Throat: Mucous membranes are moist.  No erosions to dentition.  No soft tissue erosion.  Oropharynx is not erythematous but nonedematous.  Uvula is midline. Neck: No stridor.    Cardiovascular: Normal rate, regular rhythm. Normal S1  and S2.  Good peripheral circulation. Respiratory: Normal respiratory effort without tachypnea or retractions. Lungs CTAB. Good air entry to the bases with no decreased or absent breath sounds. Gastrointestinal: Bowel sounds 4 quadrants. Soft and nontender to palpation. No guarding or rigidity. No palpable masses. No distention.  Musculoskeletal: Full range of motion to all extremities. No gross deformities appreciated. Neurologic:  Normal speech and language. No gross focal neurologic deficits are appreciated.  Skin:  Skin is warm, dry and intact. No rash noted. Psychiatric: Mood and affect are normal. Speech and behavior are normal. Patient exhibits appropriate insight and judgement.   ____________________________________________   LABS (all labs ordered are listed, but only abnormal results are displayed)  Labs Reviewed - No data to display ____________________________________________  EKG   ____________________________________________  RADIOLOGY   No results found.  ____________________________________________    PROCEDURES  Procedure(s) performed:    Procedures    Medications - No data to display   ____________________________________________   INITIAL IMPRESSION / ASSESSMENT AND PLAN / ED COURSE  Pertinent labs & imaging results that were available during my care of the patient were reviewed by me and considered in my medical decision making (see chart for details).  Review of the [place] CSRS was performed in accordance of the [facility] prior to dispensing any controlled drugs.     Patient's diagnosis is consistent with GERD.   Differential included gastritis, gastric ulcers, acid reflux, esophageal varices, achalasia, Barrett's esophagus.  Patient has a history of reflux and is here for medication for increased symptoms. Patient has not tried Tums or any other over-the-counter medication.  At this time, patient will be trialed on Zantac and Prilosec with  instructions to use Tums as needed.  After a trial of 2 weeks, if patient's symptoms have not improved, he is to follow-up with primary care..  She has no concerning findings or symptoms at this time warranting further workup.  Patient is given ED precautions to return to the ED for any worsening or new symptoms.     ____________________________________________  FINAL CLINICAL IMPRESSION(S) / ED DIAGNOSES  Final diagnoses:  Gastroesophageal reflux disease, esophagitis presence not specified      NEW MEDICATIONS STARTED DURING THIS VISIT:  ED Discharge Orders        Ordered    ranitidine (ZANTAC) 300 MG tablet  Daily at bedtime     06/20/17 1758    omeprazole (PRILOSEC) 10 MG capsule  Daily     06/20/17 1758          This chart was dictated using voice recognition software/Dragon. Despite best efforts to proofread, errors can occur which can change the meaning. Any change was purely unintentional.   Racheal PatchesCuthriell, Malin Cervini D, PA-C 06/20/17 1804    Phineas SemenGoodman, Graydon, MD 06/20/17 [phone]

## 2017-06-20 NOTE — ED Notes (Signed)
Patient reports history of reflux issues and states that he does not take medications regularly for it.  Patient reports increased nausea in the am with burning chest pain.  Patient reports vomiting this morning.  Patient states symptoms are similar to the reflux issues he had before.  Patient states, "I just want to get some medicine to try to nip this in the bud before it gets as bad as it used to be."  Patient is in no obvious distress at this time.

## 2017-07-09 ENCOUNTER — Other Ambulatory Visit: Payer: Self-pay

## 2017-07-09 ENCOUNTER — Emergency Department
Admission: EM | Admit: 2017-07-09 | Discharge: 2017-07-09 | Disposition: A | Discharge status: Home / Self Care | Payer: Self-pay | Attending: Emergency Medicine | Admitting: Emergency Medicine

## 2017-07-09 ENCOUNTER — Encounter: Payer: Self-pay | Admitting: Emergency Medicine

## 2017-07-09 DIAGNOSIS — M79602 Pain in left arm: Secondary | ICD-10-CM

## 2017-07-09 DIAGNOSIS — Z79899 Other long term (current) drug therapy: Secondary | ICD-10-CM | POA: Insufficient documentation

## 2017-07-09 DIAGNOSIS — F17210 Nicotine dependence, cigarettes, uncomplicated: Secondary | ICD-10-CM | POA: Insufficient documentation

## 2017-07-09 DIAGNOSIS — M7522 Bicipital tendinitis, left shoulder: Secondary | ICD-10-CM | POA: Insufficient documentation

## 2017-07-09 MED ORDER — MELOXICAM 15 MG PO TABS: 15.0000 mg | ORAL_TABLET | Freq: Every day | ORAL | 2 refills | Status: AC

## 2017-07-09 NOTE — Discharge Instructions (Signed)
Follow-up with your regular doctor or the acute care if you are not better in 5-7 days, take the meloxicam as prescribed, apply ice to the area as needed, if you're becoming worse return to the emergency department

## 2017-07-09 NOTE — ED Triage Notes (Signed)
Pt reports left arm for over one week. Denies injury. Pt reports he originally thought he had a left axillary boil but it has not progressed. Ambulatory to triage. No apparent distress noted.

## 2017-07-09 NOTE — ED Notes (Signed)
Says h e thought there was a boil uinder left arm a few weeks ago and it developed into current problem, but no boil there now.  Pt in nad.

## 2017-07-09 NOTE — ED Provider Notes (Signed)
[facility] Emergency Department Provider Note  ____________________________________________   First MD Initiated Contact with Patient 07/09/17 1145     (approximate)  I have reviewed the triage vital signs and the nursing notes.   HISTORY  Chief Complaint Arm Pain    HPI Gilbert Reed is a 31 y.o. male complains of left bicep pain for 2 days, states it only hurts with movement, states he thought he had an abscess in his armpit but that never came up, is just sore to raise it over his head, noticed a thick cord in his antecubital area and is worried he has a blood clot, denies any recent IV or blood draw, no trauma to the arm, is worried that his arm will hurt while he is at work, denies chest pain, shortness of breath, vomiting, diarrhea   Past Medical History:  Diagnosis Date  . Acid reflux   . Patient denies medical problems     Patient Active Problem List   Diagnosis Date Noted  . Gastroenteritis 05/23/2015  . Intractable nausea and vomiting 05/23/2015  . Dehydration 05/23/2015  . Leukocytosis 05/23/2015    Past Surgical History:  Procedure Laterality Date  . WRIST SURGERY      Prior to Admission medications   Medication Sig Start Date End Date Taking? Authorizing Provider  meloxicam (MOBIC) 15 MG tablet Take 1 tablet (15 mg total) by mouth daily. 07/09/17 07/09/18  Sherrie MustacheFisher, Roselyn BeringSusan W, PA-C  omeprazole (PRILOSEC) 10 MG capsule Take 1 capsule (10 mg total) daily by mouth. 06/20/17   Cuthriell, Delorise RoyalsJonathan D, PA-C  ranitidine (ZANTAC) 300 MG tablet Take 1 tablet (300 mg total) at bedtime by mouth. 06/20/17 06/20/18  Cuthriell, Delorise RoyalsJonathan D, PA-C    Allergies Patient has no known allergies.  No family history on file.  Social History Social History   Tobacco Use  . Smoking status: Current Every Day Smoker    Types: Cigars  . Smokeless tobacco: Never Used  Substance Use Topics  . Alcohol use: Yes    Comment: Weekend drinker- 3 beers  .  Drug use: Yes    Types: Marijuana    Review of Systems  Constitutional: No fever/chills Eyes: No visual changes. ENT: No sore throat. Respiratory: Denies cough Genitourinary: Negative for dysuria. Musculoskeletal: Negative for back pain. Positive for left bicep pain Skin: Negative for rash.    ____________________________________________   PHYSICAL EXAM:  VITAL SIGNS: ED Triage Vitals  Enc Vitals Group     BP 07/09/17 1114 122/69     Pulse Rate 07/09/17 1114 80     Resp 07/09/17 1114 18     Temp 07/09/17 1114 98.3 F (36.8 C)     Temp Source 07/09/17 1114 Oral     SpO2 07/09/17 1114 100 %     Weight 07/09/17 1115 165 lb (74.8 kg)     Height 07/09/17 1115 5\' 9"  (1.753 m)     Head Circumference --      Peak Flow --      Pain Score 07/09/17 1114 10     Pain Loc --      Pain Edu? --      Excl. in GC? --     Constitutional: Alert and oriented. Well appearing and in no acute distress. Eyes: Conjunctivae are normal.  Head: Atraumatic. Nose: No congestion/rhinnorhea. Mouth/Throat: Mucous membranes are moist.   Cardiovascular: Normal rate, regular rhythm. Respiratory: Normal respiratory effort.  No retractions GU: deferred Musculoskeletal: FROM all extremities, warm  and well perfused, bicep is tender at the distal portion, there is no cords noted, no redness or swelling noted, has full range of motion of his arm Neurologic:  Normal speech and language.  Skin:  Skin is warm, dry and intact. No rash noted. No redness or swelling Psychiatric: Mood and affect are normal. Speech and behavior are normal.  ____________________________________________   LABS (all labs ordered are listed, but only abnormal results are displayed)  Labs Reviewed - No data to display ____________________________________________   ____________________________________________  RADIOLOGY    ____________________________________________   PROCEDURES  Procedure(s) performed:  No      ____________________________________________   INITIAL IMPRESSION / ASSESSMENT AND PLAN / ED COURSE  Pertinent labs & imaging results that were available during my care of the patient were reviewed by me and considered in my medical decision making (see chart for details).  Patient's 31 year old male with bicep tendinitis, he has full range of motion of the left arm, there is no concern of blood clot as he has not had an IV and is at very low risk, he was given a prescription for meloxicam 15 mg, he is given a work note, he was told to return to emergency department if he is worsening, if he is not better in 3-5 days he should see his regular doctor      ____________________________________________   FINAL CLINICAL IMPRESSION(S) / ED DIAGNOSES  Final diagnoses:  Left arm pain  Biceps tendinitis of left upper extremity      NEW MEDICATIONS STARTED DURING THIS VISIT:  This SmartLink is deprecated. Use AVSMEDLIST instead to display the medication list for a patient.   Note:  This document was prepared using Dragon voice recognition software and may include unintentional dictation errors.    Faythe GheeFisher, Ahriyah Vannest W, PA-C 07/09/17 1705    Jene EveryKinner, Robert, MD 07/11/17 [phone]

## 2018-05-11 ENCOUNTER — Other Ambulatory Visit: Payer: Self-pay

## 2018-05-11 ENCOUNTER — Emergency Department
Admission: EM | Admit: 2018-05-11 | Discharge: 2018-05-11 | Disposition: A | Discharge status: Home / Self Care | Payer: No Typology Code available for payment source | Attending: Emergency Medicine | Admitting: Emergency Medicine

## 2018-05-11 ENCOUNTER — Encounter: Payer: Self-pay | Admitting: Emergency Medicine

## 2018-05-11 DIAGNOSIS — Z79899 Other long term (current) drug therapy: Secondary | ICD-10-CM | POA: Insufficient documentation

## 2018-05-11 DIAGNOSIS — Y929 Unspecified place or not applicable: Secondary | ICD-10-CM | POA: Insufficient documentation

## 2018-05-11 DIAGNOSIS — Y999 Unspecified external cause status: Secondary | ICD-10-CM | POA: Insufficient documentation

## 2018-05-11 DIAGNOSIS — Y939 Activity, unspecified: Secondary | ICD-10-CM | POA: Insufficient documentation

## 2018-05-11 DIAGNOSIS — M549 Dorsalgia, unspecified: Secondary | ICD-10-CM | POA: Insufficient documentation

## 2018-05-11 DIAGNOSIS — F17290 Nicotine dependence, other tobacco product, uncomplicated: Secondary | ICD-10-CM | POA: Insufficient documentation

## 2018-05-11 MED ORDER — IBUPROFEN 600 MG PO TABS: 600.0000 mg | ORAL_TABLET | Freq: Three times a day (TID) | ORAL | 0 refills | Status: DC | PRN

## 2018-05-11 MED ORDER — CYCLOBENZAPRINE HCL 10 MG PO TABS: 10.0000 mg | ORAL_TABLET | Freq: Three times a day (TID) | ORAL | 0 refills | Status: DC | PRN
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
## 2018-05-11 NOTE — ED Triage Notes (Signed)
Low back pain began yesterday. No fall or injury. Denies radiation to either leg.
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
## 2019-03-31 ENCOUNTER — Other Ambulatory Visit: Payer: Self-pay

## 2019-03-31 DIAGNOSIS — Z20822 Contact with and (suspected) exposure to covid-19: Secondary | ICD-10-CM

## 2019-04-01 LAB — NOVEL CORONAVIRUS, NAA: SARS-CoV-2, NAA: NOT DETECTED

## 2019-04-09 ENCOUNTER — Other Ambulatory Visit: Payer: Self-pay

## 2019-04-09 DIAGNOSIS — Z20822 Contact with and (suspected) exposure to covid-19: Secondary | ICD-10-CM

## 2019-04-11 LAB — NOVEL CORONAVIRUS, NAA: SARS-CoV-2, NAA: NOT DETECTED

## 2019-09-01 ENCOUNTER — Encounter: Payer: Self-pay | Admitting: Emergency Medicine

## 2019-09-01 ENCOUNTER — Other Ambulatory Visit: Payer: Self-pay

## 2019-09-01 ENCOUNTER — Emergency Department
Admission: EM | Admit: 2019-09-01 | Discharge: 2019-09-01 | Disposition: A | Discharge status: Home / Self Care | Payer: No Typology Code available for payment source | Attending: Emergency Medicine | Admitting: Emergency Medicine

## 2019-09-01 ENCOUNTER — Emergency Department: Payer: No Typology Code available for payment source

## 2019-09-01 DIAGNOSIS — M7542 Impingement syndrome of left shoulder: Secondary | ICD-10-CM

## 2019-09-01 DIAGNOSIS — S29012A Strain of muscle and tendon of back wall of thorax, initial encounter: Secondary | ICD-10-CM

## 2019-09-01 DIAGNOSIS — Y92410 Unspecified street and highway as the place of occurrence of the external cause: Secondary | ICD-10-CM | POA: Insufficient documentation

## 2019-09-01 DIAGNOSIS — F17200 Nicotine dependence, unspecified, uncomplicated: Secondary | ICD-10-CM | POA: Insufficient documentation

## 2019-09-01 DIAGNOSIS — Y939 Activity, unspecified: Secondary | ICD-10-CM | POA: Insufficient documentation

## 2019-09-01 DIAGNOSIS — Y999 Unspecified external cause status: Secondary | ICD-10-CM | POA: Insufficient documentation

## 2019-09-01 MED ORDER — MELOXICAM 15 MG PO TABS: 15.0000 mg | ORAL_TABLET | Freq: Every day | ORAL | 0 refills | Status: AC

## 2019-09-01 MED ORDER — METHOCARBAMOL 500 MG PO TABS: 500.0000 mg | ORAL_TABLET | Freq: Four times a day (QID) | ORAL | 0 refills | Status: AC
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
## 2019-09-24 ENCOUNTER — Ambulatory Visit: Payer: No Typology Code available for payment source | Attending: Internal Medicine

## 2019-11-18 ENCOUNTER — Emergency Department
Admission: EM | Admit: 2019-11-18 | Discharge: 2019-11-18 | Disposition: A | Discharge status: Home / Self Care | Payer: No Typology Code available for payment source | Attending: Emergency Medicine | Admitting: Emergency Medicine

## 2019-11-18 ENCOUNTER — Other Ambulatory Visit: Payer: Self-pay

## 2019-11-18 ENCOUNTER — Encounter: Payer: Self-pay | Admitting: Emergency Medicine

## 2019-11-18 DIAGNOSIS — Z5321 Procedure and treatment not carried out due to patient leaving prior to being seen by health care provider: Secondary | ICD-10-CM | POA: Diagnosis not present

## 2019-11-18 DIAGNOSIS — M25512 Pain in left shoulder: Secondary | ICD-10-CM | POA: Insufficient documentation
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
## 2020-08-24 ENCOUNTER — Other Ambulatory Visit: Payer: No Typology Code available for payment source
